# Patient Record
Sex: Female | Born: 1985 | Race: Black or African American | Hispanic: No | Marital: Married | Smoking: Never smoker
Health system: Southern US, Community
[De-identification: ages and names within clinical notes are randomized; demographics above are authoritative.]

## PROBLEM LIST (undated history)

## (undated) HISTORY — PX: CHOLECYSTECTOMY: SHX55

---

## 2022-02-28 ENCOUNTER — Emergency Department: Payer: Medicaid - Out of State

## 2022-02-28 ENCOUNTER — Emergency Department
Admission: EM | Admit: 2022-02-28 | Discharge: 2022-02-28 | Disposition: A | Discharge status: Home / Self Care | Payer: Medicaid - Out of State | Attending: Emergency Medicine | Admitting: Emergency Medicine

## 2022-02-28 ENCOUNTER — Encounter: Payer: Self-pay | Admitting: Emergency Medicine

## 2022-02-28 ENCOUNTER — Other Ambulatory Visit: Payer: Self-pay

## 2022-02-28 DIAGNOSIS — S199XXA Unspecified injury of neck, initial encounter: Secondary | ICD-10-CM | POA: Diagnosis present

## 2022-02-28 DIAGNOSIS — S161XXA Strain of muscle, fascia and tendon at neck level, initial encounter: Secondary | ICD-10-CM | POA: Diagnosis not present

## 2022-02-28 DIAGNOSIS — R519 Headache, unspecified: Secondary | ICD-10-CM | POA: Diagnosis not present

## 2022-02-28 DIAGNOSIS — Y92410 Unspecified street and highway as the place of occurrence of the external cause: Secondary | ICD-10-CM | POA: Insufficient documentation
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
## 2022-09-02 DIAGNOSIS — R300 Dysuria: Secondary | ICD-10-CM | POA: Diagnosis not present

## 2022-09-02 DIAGNOSIS — U071 COVID-19: Secondary | ICD-10-CM | POA: Diagnosis not present

## 2022-09-02 DIAGNOSIS — J029 Acute pharyngitis, unspecified: Secondary | ICD-10-CM | POA: Diagnosis not present

## 2022-10-31 ENCOUNTER — Ambulatory Visit: Payer: Medicaid - Out of State

## 2022-11-19 ENCOUNTER — Other Ambulatory Visit: Payer: Self-pay

## 2022-11-19 ENCOUNTER — Encounter: Payer: Self-pay | Admitting: Emergency Medicine

## 2022-11-19 ENCOUNTER — Emergency Department
Admission: EM | Admit: 2022-11-19 | Discharge: 2022-11-19 | Disposition: A | Discharge status: Home / Self Care | Payer: Medicaid Other | Attending: Emergency Medicine | Admitting: Emergency Medicine

## 2022-11-19 ENCOUNTER — Emergency Department: Payer: Medicaid Other

## 2022-11-19 DIAGNOSIS — R0789 Other chest pain: Secondary | ICD-10-CM | POA: Diagnosis not present

## 2022-11-19 DIAGNOSIS — R202 Paresthesia of skin: Secondary | ICD-10-CM | POA: Insufficient documentation

## 2022-11-19 DIAGNOSIS — R200 Anesthesia of skin: Secondary | ICD-10-CM | POA: Diagnosis not present

## 2022-11-19 DIAGNOSIS — R079 Chest pain, unspecified: Secondary | ICD-10-CM

## 2022-11-19 LAB — BASIC METABOLIC PANEL
Anion gap: 7 (ref 5–15)
BUN: 9 mg/dL (ref 6–20)
CO2: 25 mmol/L (ref 22–32)
Calcium: 8.5 mg/dL — ABNORMAL LOW (ref 8.9–10.3)
Chloride: 105 mmol/L (ref 98–111)
Creatinine, Ser: 0.85 mg/dL (ref 0.44–1.00)
GFR, Estimated: >60 mL/min (ref >60)
Glucose, Bld: 98 mg/dL (ref 70–99)
Potassium: 3.5 mmol/L (ref 3.5–5.1)
Sodium: 137 mmol/L (ref 135–145)

## 2022-11-19 LAB — CBC
HCT: 39.5 % (ref 36.0–46.0)
Hemoglobin: 12.6 g/dL (ref 12.0–15.0)
MCH: 28.4 pg (ref 26.0–34.0)
MCHC: 31.9 g/dL (ref 30.0–36.0)
MCV: 89.2 fL (ref 80.0–100.0)
Platelets: 296 10*3/uL (ref 150–400)
RBC: 4.43 MIL/uL (ref 3.87–5.11)
RDW: 13.9 % (ref 11.5–15.5)
WBC: 6.8 10*3/uL (ref 4.0–10.5)
nRBC: 0 % (ref 0.0–0.2)

## 2022-11-19 LAB — TROPONIN I (HIGH SENSITIVITY): Troponin I (High Sensitivity): <2 ng/L (ref <18)

## 2022-11-19 MED ORDER — KETOROLAC TROMETHAMINE 30 MG/ML IJ SOLN
30.0000 mg | Freq: Once | INTRAMUSCULAR | Status: DC
  Filled 2022-11-19: qty 1

## 2022-11-19 MED ORDER — OMEPRAZOLE MAGNESIUM 20 MG PO TBEC: 20.0000 mg | DELAYED_RELEASE_TABLET | Freq: Every day | ORAL | 0 refills | Status: AC

## 2022-11-19 NOTE — ED Provider Notes (Signed)
[facility] Provider Note    Event Date/Time   First MD Initiated Contact with Patient 11/19/22 [phone]     (approximate)   History   Chest Pain   HPI  Becky Byrd is a 37 y.o. female presents to the emergency department chest pain.  Chest pain started last night and described a left-sided chest pressure that radiated down the left arm.  Some numbness and tingling to her left fingertips.  States that she went to sleep last night and this morning's had some worsening pressure sensation feels like there is something stuck in her throat.  Denies prior history of acid reflux.  Denies history of DVT or PE.  No estrogen medications.  No family history of coronary artery disease at a young age.  Denies any tobacco use or vaping.     Physical Exam   Triage Vital Signs: ED Triage Vitals  Enc Vitals Group     BP 11/19/22 0824 118/81     Pulse Rate 11/19/22 0824 67     Resp 11/19/22 0824 18     Temp 11/19/22 0824 98.4 F (36.9 C)     Temp Source 11/19/22 0824 Oral     SpO2 11/19/22 0824 100 %     Weight 11/19/22 0820 154 lb (69.9 kg)     Height 11/19/22 0820 5\' 4"  (1.626 m)     Head Circumference --      Peak Flow --      Pain Score 11/19/22 0820 7     Pain Loc --      Pain Edu? --      Excl. in [place]? --     Most recent vital signs: Vitals:   11/19/22 0824  BP: 118/81  Pulse: 67  Resp: 18  Temp: 98.4 F (36.9 C)  SpO2: 100%    Physical Exam Constitutional:      Appearance: She is well-developed.  HENT:     Head: Atraumatic.  Eyes:     Conjunctiva/sclera: Conjunctivae normal.  Cardiovascular:     Rate and Rhythm: Regular rhythm.     Heart sounds: Normal heart sounds.  Pulmonary:     Effort: No respiratory distress.  Abdominal:     General: There is no distension.  Musculoskeletal:        General: Normal range of motion.     Cervical back: Normal range of motion.  Skin:    General: Skin is warm.  Neurological:     Mental Status: She is  alert. Mental status is at baseline.     IMPRESSION / MDM / ASSESSMENT AND PLAN / ED COURSE  I reviewed the triage vital signs and the nursing notes.  Differential diagnosis including musculoskeletal, acid reflux, ACS, pneumonia, pericarditis, costochondritis.  Clinical picture is not consistent with dissection.  PERC negative low suspicion for pulmonary embolism.  Offered pain medication however patient declined.  EKG  I, Nathaniel Man, the attending physician, personally viewed and interpreted this ECG.   Rate: Normal  Rhythm: Normal sinus  Axis: Normal  Intervals: Normal  ST&T Change: None  No tachycardic or bradycardic dysrhythmias while on cardiac telemetry.  RADIOLOGY I independently reviewed imaging, my interpretation of imaging: Chest x-ray no focal findings consistent with pneumonia.  Read as no acute findings  LABS (all labs ordered are listed, but only abnormal results are displayed) Labs interpreted as -  Troponin undetectable.  No significant anemia.  No signs of an infectious process.  Labs Reviewed  BASIC METABOLIC PANEL - Abnormal; Notable for the following components:      Result Value   Calcium 8.5 (*)    All other components within normal limits  CBC  POC URINE PREG, ED  TROPONIN I (HIGH SENSITIVITY)    TREATMENT   Patient is low risk heart score, troponin is undetectable and chest pain has been ongoing for more than 2 hours do not feel that serial troponins are necessary at this time.  Have low suspicion for ACS.  Given her globus sensation and chest pain that is worse with laying down concern for possible acid reflux.  Will start the patient on a PPI and discussed food choices with acid reflux.  Given return precautions for any ongoing or worsening symptoms.  Given information to establish with a primary care provider.   PROCEDURES:  Critical Care performed: No  Procedures  Patient's presentation is most consistent with acute presentation with  potential threat to life or bodily function.   MEDICATIONS ORDERED IN ED: Medications - No data to display  FINAL CLINICAL IMPRESSION(S) / ED DIAGNOSES   Final diagnoses:  Chest pain, unspecified type     Rx / DC Orders   ED Discharge Orders          Ordered    omeprazole (PRILOSEC OTC) 20 MG tablet  Daily        11/19/22 0934             Note:  This document was prepared using Dragon voice recognition software and may include unintentional dictation errors.   Nathaniel Man, MD 11/19/22 [phone]

## 2022-11-19 NOTE — Discharge Instructions (Signed)
You were seen in the emergency department for chest pain.  You had a chest x-ray that did not show any signs of pneumonia.  Your vital signs were normal.  Your heart enzyme was undetectable, do not believe you are having a heart attack today.  Concern it could be from acid reflux, start an acid reducing medication and take this daily.  Follow-up with her primary care physician and return to the emergency department for any ongoing or worsening symptoms.  Pain control:  Ibuprofen (motrin/aleve/advil) - You can take 3-4 tablets (600-800 mg) every 6 hours as needed for pain/fever.  Acetaminophen (tylenol) - You can take 2 extra strength tablets (1000 mg) every 6 hours as needed for pain/fever.  You can alternate these medications or take them together.  Make sure you eat food/drink water when taking these medications.

## 2022-11-19 NOTE — ED Triage Notes (Signed)
Patient to ED via POV for chest pain that started last PM. Patient states the pain feels like pressure in the middle of her chest that radiates into left arm. No cardiac hx.

## 2022-11-21 ENCOUNTER — Telehealth: Payer: Self-pay | Admitting: *Deleted

## 2022-11-21 NOTE — Patient Outreach (Signed)
  Care Coordination [facility] Note Transition Care Management Follow-up Telephone Call Date of discharge and from where: 11/19/22 from [facility] ED How have you been since you were released from the hospital? "I'm alright" Any questions or concerns? No  Items Reviewed: Did the pt receive and understand the discharge instructions provided? Yes  Medications obtained and verified? No  Other? No  Any new allergies since your discharge?  Unknown Dietary orders reviewed? No Do you have support at home?  Unknown  Home Care and Equipment/Supplies: Were home health services ordered? no If so, what is the name of the agency? N/A  Has the agency set up a time to come to the patient's home? not applicable Were any new equipment or medical supplies ordered?  No What is the name of the medical supply agency? N/A Were you able to get the supplies/equipment? not applicable Do you have any questions related to the use of the equipment or supplies? No  Functional Questionnaire: (I = Independent and D = Dependent) ADLs: Unable to ask, call was disconnected    Follow up appointments reviewed:  PCP Hospital f/u appt confirmed?  N/A, ED visit  . [facility] f/u appt confirmed?  N/A, ED visit  . Are transportation arrangements needed?  Unknown If their condition worsens, is the pt aware to call PCP or go to the Emergency Dept.? Yes Was the patient provided with contact information for the PCP's office or ED? No Was to pt encouraged to call back with questions or concerns? Yes  SDOH assessments and interventions completed:   No, call was disconnected  RNCM unable to complete TOC, call was disconnected. RNCM made attempt to reconnect, left message with RNCM contact information, advising patient to call with questions or concerns and encouraging patient to establish care with a Primary Care Provider.  Lurena Joiner RN, BSN [place]  [employer] manager

## 2023-02-04 DIAGNOSIS — R0989 Other specified symptoms and signs involving the circulatory and respiratory systems: Secondary | ICD-10-CM | POA: Diagnosis not present

## 2023-02-04 DIAGNOSIS — J069 Acute upper respiratory infection, unspecified: Secondary | ICD-10-CM | POA: Diagnosis not present

## 2023-02-04 DIAGNOSIS — S0083XA Contusion of other part of head, initial encounter: Secondary | ICD-10-CM | POA: Diagnosis not present

## 2023-03-06 ENCOUNTER — Other Ambulatory Visit: Payer: Self-pay

## 2023-03-06 ENCOUNTER — Emergency Department
Admission: EM | Admit: 2023-03-06 | Discharge: 2023-03-06 | Disposition: A | Discharge status: Home / Self Care | Payer: Medicaid Other | Attending: Emergency Medicine | Admitting: Emergency Medicine

## 2023-03-06 ENCOUNTER — Emergency Department: Payer: Medicaid Other

## 2023-03-06 ENCOUNTER — Encounter: Payer: Self-pay | Admitting: Emergency Medicine

## 2023-03-06 DIAGNOSIS — R109 Unspecified abdominal pain: Secondary | ICD-10-CM | POA: Diagnosis not present

## 2023-03-06 DIAGNOSIS — R079 Chest pain, unspecified: Secondary | ICD-10-CM | POA: Insufficient documentation

## 2023-03-06 DIAGNOSIS — R1031 Right lower quadrant pain: Secondary | ICD-10-CM | POA: Diagnosis not present

## 2023-03-06 DIAGNOSIS — R10813 Right lower quadrant abdominal tenderness: Secondary | ICD-10-CM | POA: Diagnosis not present

## 2023-03-06 DIAGNOSIS — R072 Precordial pain: Secondary | ICD-10-CM | POA: Diagnosis present

## 2023-03-06 LAB — URINALYSIS, ROUTINE W REFLEX MICROSCOPIC
Bilirubin Urine: NEGATIVE
Glucose, UA: NEGATIVE mg/dL
Hgb urine dipstick: NEGATIVE
Ketones, ur: 5 mg/dL — AB
Leukocytes,Ua: NEGATIVE
Nitrite: NEGATIVE
Protein, ur: 30 mg/dL — AB
Specific Gravity, Urine: 1.03 (ref 1.005–1.030)
pH: 6 (ref 5.0–8.0)

## 2023-03-06 LAB — HEPATIC FUNCTION PANEL
ALT: 11 U/L (ref 0–44)
AST: 20 U/L (ref 15–41)
Albumin: 3.9 g/dL (ref 3.5–5.0)
Alkaline Phosphatase: 53 U/L (ref 38–126)
Bilirubin, Direct: <0.1 mg/dL (ref 0.0–0.2)
Total Bilirubin: 0.7 mg/dL (ref 0.3–1.2)
Total Protein: 7.3 g/dL (ref 6.5–8.1)

## 2023-03-06 LAB — LIPASE, BLOOD: Lipase: 26 U/L (ref 11–51)

## 2023-03-06 LAB — BASIC METABOLIC PANEL
Anion gap: 11 (ref 5–15)
BUN: 8 mg/dL (ref 6–20)
CO2: 23 mmol/L (ref 22–32)
Calcium: 8.8 mg/dL — ABNORMAL LOW (ref 8.9–10.3)
Chloride: 102 mmol/L (ref 98–111)
Creatinine, Ser: 0.72 mg/dL (ref 0.44–1.00)
GFR, Estimated: >60 mL/min (ref >60)
Glucose, Bld: 86 mg/dL (ref 70–99)
Potassium: 3 mmol/L — ABNORMAL LOW (ref 3.5–5.1)
Sodium: 136 mmol/L (ref 135–145)

## 2023-03-06 LAB — CBC
HCT: 34.6 % — ABNORMAL LOW (ref 36.0–46.0)
Hemoglobin: 11 g/dL — ABNORMAL LOW (ref 12.0–15.0)
MCH: 27 pg (ref 26.0–34.0)
MCHC: 31.8 g/dL (ref 30.0–36.0)
MCV: 85 fL (ref 80.0–100.0)
Platelets: 333 10*3/uL (ref 150–400)
RBC: 4.07 MIL/uL (ref 3.87–5.11)
RDW: 13.5 % (ref 11.5–15.5)
WBC: 8.9 10*3/uL (ref 4.0–10.5)
nRBC: 0 % (ref 0.0–0.2)

## 2023-03-06 LAB — TROPONIN I (HIGH SENSITIVITY): Troponin I (High Sensitivity): <2 ng/L (ref <18)

## 2023-03-06 LAB — POC URINE PREG, ED: Preg Test, Ur: NEGATIVE

## 2023-03-06 MED ORDER — IBUPROFEN 800 MG PO TABS: 800.0000 mg | ORAL_TABLET | Freq: Once | ORAL | Status: DC

## 2023-03-06 MED ORDER — IOHEXOL 300 MG/ML  SOLN
100.0000 mL | Freq: Once | INTRAMUSCULAR | Status: AC | PRN
  Administered 2023-03-06: 100 mL via INTRAVENOUS

## 2023-03-06 MED ORDER — POTASSIUM CHLORIDE CRYS ER 20 MEQ PO TBCR
40.0000 meq | EXTENDED_RELEASE_TABLET | Freq: Once | ORAL | Status: AC
  Administered 2023-03-06: 40 meq via ORAL
  Filled 2023-03-06: qty 2

## 2023-03-06 MED ORDER — ONDANSETRON 4 MG PO TBDP: 4.0000 mg | ORAL_TABLET | Freq: Once | ORAL | Status: DC

## 2023-03-06 NOTE — ED Notes (Signed)
Pt up to the restroom to provide sample.

## 2023-03-06 NOTE — ED Provider Notes (Signed)
[facility] Emergency Department Provider Note     Event Date/Time   First MD Initiated Contact with Patient 03/06/23 1744     (approximate)   History   Chest Pain   HPI  Becky Byrd is a 37 y.o. female with a noncontributory medical history, presents to the ED for evaluation of 3 days of midsternal chest pain.  Patient reports intermittent symptoms for the last 2 days with some associated right lower quadrant abdominal pain for the last 5 days.  Denies any nausea, vomiting, diarrhea.  No reports of cough, congestion, or shortness of breath reported.  Physical Exam   Triage Vital Signs: ED Triage Vitals [03/06/23 1447]  Enc Vitals Group     BP 109/73     Pulse Rate 63     Resp 18     Temp 98.9 F (37.2 C)     Temp Source Oral     SpO2 100 %     Weight      Height      Head Circumference      Peak Flow      Pain Score 7     Pain Loc      Pain Edu?      Excl. in GC?     Most recent vital signs: Vitals:   03/06/23 1447  BP: 109/73  Pulse: 63  Resp: 18  Temp: 98.9 F (37.2 C)  SpO2: 100%    General Awake, no distress. NAD HEENT NCAT. PERRL. EOMI. No rhinorrhea. Mucous membranes are moist.  CV:  Good peripheral perfusion. RRR RESP:  Normal effort. CTA ABD:  No distention.  Nontender.  No significant rebound, guarding, or rigidity.  Patient localizes some pain to the right lower quadrant suprapubic region. MSK:  Normal active range of motion of all extremities. NEURO: Cranial nerves II to XII grossly intact.  ED Results / Procedures / Treatments   Labs (all labs ordered are listed, but only abnormal results are displayed) Labs Reviewed  BASIC METABOLIC PANEL - Abnormal; Notable for the following components:      Result Value   Potassium 3.0 (*)    Calcium 8.8 (*)    All other components within normal limits  CBC - Abnormal; Notable for the following components:   Hemoglobin 11.0 (*)    HCT 34.6 (*)    All other components  within normal limits  URINALYSIS, ROUTINE W REFLEX MICROSCOPIC - Abnormal; Notable for the following components:   Color, Urine AMBER (*)    APPearance HAZY (*)    Ketones, ur 5 (*)    Protein, ur 30 (*)    Bacteria, UA RARE (*)    All other components within normal limits  LIPASE, BLOOD  HEPATIC FUNCTION PANEL  POC URINE PREG, ED  TROPONIN I (HIGH SENSITIVITY)    EKG  Vent. rate 65 BPM PR interval 156 ms QRS duration 80 ms QT/QTcB 288/299 ms P-R-T axes 73 77 89 Normal sinus rhythm Nonspecific T wave abnormality Abnormal ECG When compared with  RADIOLOGY  I personally viewed and evaluated these images as part of my medical decision making, as well as reviewing the written report by the radiologist.  ED Provider Interpretation: no acute findings  CXR IMPRESSION: No active cardiopulmonary disease.  CT ABD /PELVIS w/ CM IMPRESSION: 1. No acute abnormality in the abdomen or pelvis. 2. Normal appendix. 3. Status post cholecystectomy.  PROCEDURES:  Critical Care performed: No  Procedures   MEDICATIONS  ORDERED IN ED: Medications  potassium chloride SA (KLOR-CON M) CR tablet 40 mEq (40 mEq Oral Given 03/06/23 2006)  iohexol (OMNIPAQUE) 300 MG/ML solution 100 mL (100 mLs Intravenous Contrast Given 03/06/23 2117)     IMPRESSION / MDM / ASSESSMENT AND PLAN / ED COURSE  I reviewed the triage vital signs and the nursing notes.                              Differential diagnosis includes, but is not limited to, ACS, aortic dissection, pulmonary embolism, pneumothorax, pneumonia, pericarditis, myocarditis, GI-related causes including esophagitis/gastritis, and musculoskeletal chest wall pain, ovarian cyst, ovarian torsion, acute appendicitis, urinary tract infection/pyelonephritis, endometriosis, bowel obstruction, colitis, renal colic, gastroenteritis, hernia, fibroids, endometriosis, pregnancy related pain including ectopic pregnancy, etc.  Patient's presentation is most  consistent with acute complicated illness / injury requiring diagnostic workup.  Patient's diagnosis is consistent with nonspecific chest pain and right lower quadrant abdominal pain.  Patient presented for evaluation of her symptoms, had a reassuring exam and workup overall.  No red flags on exam.  Low concern for ACS or AMI as patient has had symptoms for several days.  Troponin was normal and labs otherwise flat without any leukocytosis, critical anemia, or electrolyte abnormalities.  Further evaluation with chest x-ray was negative and showed no acute intrathoracic patient interpretation.  CT of the abdomen/pelvis for evaluation of right lower quadrant pain was also negative based on my interpretation of images.  Patient declined any further workup after the initial labs, EKG, and diagnostic testing.  We discussed possibility of a GYN etiology including ovarian cyst.  Patient declined pelvic ultrasound at this time.  Patient also declined any pressure medications. Patient is to follow up with a local urgent care or community clinic as needed or otherwise directed. Patient is given ED precautions to return to the ED for any worsening or new symptoms.   FINAL CLINICAL IMPRESSION(S) / ED DIAGNOSES   Final diagnoses:  Nonspecific chest pain  Right lower quadrant abdominal pain     Rx / DC Orders   ED Discharge Orders     None        Note:  This document was prepared using Dragon voice recognition software and may include unintentional dictation errors.    Lissa Hoard, PA-C 03/08/23 0006    Sharman Cheek, MD 03/11/23 1003

## 2023-03-06 NOTE — Discharge Instructions (Signed)
Your exam, labs, EKG, chest x-ray, and CT scan are normal and reassuring at time but no signs of any serious cardiac causes to your chest pain.  Your CT scan also did not reveal any appendicitis or any other abnormalities to explain her abdominal pain.  You should follow-up with the primary provider for ongoing symptom management and evaluation.  Return to the ED for worsening or persistent symptoms.  Please go to the following website to schedule new (and existing) patient appointments:   [email or web address]   The following is a list of primary care offices in the area who are accepting new patients at this time.  Please reach out to one of them directly and let them know you would like to schedule an appointment to follow up on an Emergency Department visit, and/or to establish a new primary care provider (PCP).  There are likely other primary care clinics in the are who are accepting new patients, but this is an excellent place to start:  [facility] Lead physician: Dr Shirlee Latch [address] [place], [place] [address] [phone]  [facility] Lead Physician: Dr Alba Cory [address], [place], [place] [address] [phone]  [facility]  Lead Physician: Dr Olevia Perches [address] [place], [place] [address] [phone]  [facility] Lead Physician: Dr Sofie Hartigan [address], [place], [place] [address] [phone]  [facility] Lead Physician: Dr Bari Edward [address] [place], [place], [place] [address] [phone]

## 2023-03-06 NOTE — ED Triage Notes (Signed)
Patient to ED via POV for mid-sternal chest pain x3 days. Patient states she has also had RLQ abd pain x5 days. Denies N/V/D. Nad noted.

## 2023-03-08 NOTE — ED Provider Notes (Incomplete)
[facility] Emergency Department Provider Note     Event Date/Time   First MD Initiated Contact with Patient 03/06/23 1744     (approximate)   History   Chest Pain   HPI  Becky Byrd is a 37 y.o. female with a noncontributory medical history, presents to the ED for evaluation of 3 days of midsternal chest pain.  Patient reports intermittent symptoms for the last 2 days with some associated right lower quadrant abdominal pain for the last 5 days.  Denies any nausea, vomiting, diarrhea.  No reports of cough, congestion, or shortness of breath reported.  Physical Exam   Triage Vital Signs: ED Triage Vitals [03/06/23 1447]  Enc Vitals Group     BP 109/73     Pulse Rate 63     Resp 18     Temp 98.9 F (37.2 C)     Temp Source Oral     SpO2 100 %     Weight      Height      Head Circumference      Peak Flow      Pain Score 7     Pain Loc      Pain Edu?      Excl. in GC?     Most recent vital signs: Vitals:   03/06/23 1447  BP: 109/73  Pulse: 63  Resp: 18  Temp: 98.9 F (37.2 C)  SpO2: 100%    General Awake, no distress. NAD HEENT NCAT. PERRL. EOMI. No rhinorrhea. Mucous membranes are moist.  CV:  Good peripheral perfusion. RRR RESP:  Normal effort. CTA ABD:  No distention.  Nontender.  No significant rebound, guarding, or rigidity.  Patient localizes some pain to the right lower quadrant suprapubic region. MSK:  Normal active range of motion of all extremities. NEURO: Cranial nerves II to XII grossly intact.  ED Results / Procedures / Treatments   Labs (all labs ordered are listed, but only abnormal results are displayed) Labs Reviewed  BASIC METABOLIC PANEL - Abnormal; Notable for the following components:      Result Value   Potassium 3.0 (*)    Calcium 8.8 (*)    All other components within normal limits  CBC - Abnormal; Notable for the following components:   Hemoglobin 11.0 (*)    HCT 34.6 (*)    All other components  within normal limits  URINALYSIS, ROUTINE W REFLEX MICROSCOPIC - Abnormal; Notable for the following components:   Color, Urine AMBER (*)    APPearance HAZY (*)    Ketones, ur 5 (*)    Protein, ur 30 (*)    Bacteria, UA RARE (*)    All other components within normal limits  LIPASE, BLOOD  HEPATIC FUNCTION PANEL  POC URINE PREG, ED  TROPONIN I (HIGH SENSITIVITY)    EKG  Vent. rate 65 BPM PR interval 156 ms QRS duration 80 ms QT/QTcB 288/299 ms P-R-T axes 73 77 89 Normal sinus rhythm Nonspecific T wave abnormality Abnormal ECG When compared with  RADIOLOGY  I personally viewed and evaluated these images as part of my medical decision making, as well as reviewing the written report by the radiologist.  ED Provider Interpretation: no acute findings  CXR IMPRESSION: No active cardiopulmonary disease.  CT ABD /PELVIS w/ CM   PROCEDURES:  Critical Care performed: No  Procedures   MEDICATIONS ORDERED IN ED: Medications  potassium chloride SA (KLOR-CON M) CR tablet 40 mEq (40 mEq  Oral Given 03/06/23 2006)  iohexol (OMNIPAQUE) 300 MG/ML solution 100 mL (100 mLs Intravenous Contrast Given 03/06/23 2117)     IMPRESSION / MDM / ASSESSMENT AND PLAN / ED COURSE  I reviewed the triage vital signs and the nursing notes.                              Differential diagnosis includes, but is not limited to, ACS, aortic dissection, pulmonary embolism, cardiac tamponade, pneumothorax, pneumonia, pericarditis, myocarditis, GI-related causes including esophagitis/gastritis, and musculoskeletal chest wall pain.     Patient's presentation is most consistent with acute complicated illness / injury requiring diagnostic workup.  Patient's diagnosis is consistent with ***. Patient will be discharged home with prescriptions for ***. Patient is to follow up with *** as needed or otherwise directed. Patient is given ED precautions to return to the ED for any worsening or new symptoms.      FINAL CLINICAL IMPRESSION(S) / ED DIAGNOSES   Final diagnoses:  Nonspecific chest pain  Right lower quadrant abdominal pain     Rx / DC Orders   ED Discharge Orders     None        Note:  This document was prepared using Dragon voice recognition software and may include unintentional dictation errors.
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
